# Patient Record
Sex: Male | Born: 1995 | Race: White | Hispanic: No | Marital: Single | Smoking: Never smoker
Health system: Southern US, Community
[De-identification: ages and names within clinical notes are randomized; demographics above are authoritative.]

---

## 2021-06-03 ENCOUNTER — Other Ambulatory Visit: Payer: Self-pay

## 2021-06-03 ENCOUNTER — Encounter (HOSPITAL_COMMUNITY): Payer: Self-pay

## 2021-06-03 ENCOUNTER — Emergency Department (HOSPITAL_COMMUNITY)
Admission: EM | Admit: 2021-06-03 | Discharge: 2021-06-03 | Disposition: A | Discharge status: Home / Self Care | Payer: Self-pay | Attending: Emergency Medicine | Admitting: Emergency Medicine

## 2021-06-03 ENCOUNTER — Emergency Department (HOSPITAL_COMMUNITY): Payer: Self-pay

## 2021-06-03 DIAGNOSIS — R519 Headache, unspecified: Secondary | ICD-10-CM | POA: Insufficient documentation

## 2021-06-03 DIAGNOSIS — H538 Other visual disturbances: Secondary | ICD-10-CM | POA: Insufficient documentation

## 2021-06-03 DIAGNOSIS — H9313 Tinnitus, bilateral: Secondary | ICD-10-CM | POA: Insufficient documentation

## 2021-06-03 MED ORDER — IBUPROFEN 200 MG PO TABS
600.0000 mg | ORAL_TABLET | Freq: Once | ORAL | Status: AC
  Administered 2021-06-03: 600 mg via ORAL
  Filled 2021-06-03: qty 3

## 2021-06-03 NOTE — ED Provider Notes (Signed)
[facility]-EMERGENCY DEPT Provider Note   CSN: [id number] Arrival date & time: 06/03/21  0020     History Chief Complaint  Patient presents with   Headache    Calvin Owens is a 25 y.o. male.  The history is provided by the patient.  Headache Calvin Owens is a 25 y.o. male who presents to the Emergency Department complaining of HA.  He presents to the ED for evaluation HA that began around 11pm.  HA is described as firey and located in the back of the head.  Sxs started about 30 minutes after smoked marijuana out of a bong.  HA is improving at time of ED assesment.  HA is worse than his typical HA.  He temporarily lost most of his vision and hearing (blurry vision and ringing in the ears).  Felt hot then cold.    No nausea or vomiting.  Vision and hearing back to baseline.  No new numbness/weakness.  No fever.    Has no known medical problems.    No tobacco.  Occasional alcohol (half a beer tonight).  Uses marijuana regularly.      History reviewed. No pertinent past medical history.  There are no problems to display for this patient.   History reviewed. No pertinent surgical history.     No family history on file.  Social History   Tobacco Use   Smoking status: Never   Smokeless tobacco: Never  Substance Use Topics   Alcohol use: Not Currently   Drug use: Yes    Types: Marijuana    Home Medications Prior to Admission medications   Not on File    Allergies    Patient has no known allergies.  Review of Systems   Review of Systems  Neurological:  Positive for headaches.  All other systems reviewed and are negative.  Physical Exam Updated Vital Signs BP 109/60   Pulse 81   Temp 97.8 F (36.6 C) (Oral)   Resp 15   Ht 5\' 10"  (1.778 m)   Wt 90.7 kg   SpO2 96%   BMI 28.70 kg/m   Physical Exam Vitals and nursing note reviewed.  Constitutional:      Appearance: He is well-developed.  HENT:     Head: Normocephalic and  atraumatic.  Cardiovascular:     Rate and Rhythm: Normal rate and regular rhythm.     Heart sounds: No murmur heard. Pulmonary:     Effort: Pulmonary effort is normal. No respiratory distress.     Breath sounds: Normal breath sounds.  Abdominal:     Palpations: Abdomen is soft.     Tenderness: There is no abdominal tenderness. There is no guarding or rebound.  Musculoskeletal:        General: No tenderness.  Skin:    General: Skin is warm and dry.  Neurological:     Mental Status: He is alert and oriented to person, place, and time.     Comments: No asymmetry of facial movements. Visual fields grossly intact. Five out of five strength in all four extremities  Psychiatric:        Behavior: Behavior normal.    ED Results / Procedures / Treatments   Labs (all labs ordered are listed, but only abnormal results are displayed) Labs Reviewed - No data to display  EKG None  Radiology CT Head Wo Contrast  Result Date: 06/03/2021 CLINICAL DATA:  Initial evaluation for acute headache. EXAM: CT HEAD WITHOUT CONTRAST TECHNIQUE: Contiguous axial images  were obtained from the base of the skull through the vertex without intravenous contrast. COMPARISON:  None. FINDINGS: Brain: Cerebral volume within normal limits for patient age. No evidence for acute intracranial hemorrhage. No findings to suggest acute large vessel territory infarct. No mass lesion, midline shift, or mass effect. Ventricles are normal in size without evidence for hydrocephalus. No extra-axial fluid collection identified. Vascular: No hyperdense vessel identified. Skull: Scalp soft tissues demonstrate no acute abnormality. Calvarium intact. Sinuses/Orbits: Globes and orbital soft tissues within normal limits. Visualized paranasal sinuses are clear. No mastoid effusion. IMPRESSION: Normal head CT.  No acute intracranial abnormality. Electronically Signed   By: Rise Mu M.D.   On: 06/03/2021 02:07     Procedures Procedures   Medications Ordered in ED Medications  ibuprofen (ADVIL) tablet 600 mg (600 mg Oral Given 06/03/21 0242)    ED Course  I have reviewed the triage vital signs and the nursing notes.  Pertinent labs & imaging results that were available during my care of the patient were reviewed by me and considered in my medical decision making (see chart for details).    MDM Rules/Calculators/A&P                          patient here for evaluation of headache,  transient change in vision and hearing which occurred shortly after using marijuana. On evaluation his headache is improving and he is non-toxic appearing. He has no focal neurologic deficits. He does describe this is a severe headache. CT head is negative for acute abnormality. Discussed with patient recommendation to discontinue marijuana use as this may be contributing to his symptoms. Recommend ibuprofen/Tylenol as needed according to label instructions as needed for headache.  Presentation is not consistent with meningitis, dural sinus thrombosis, aneurysm. Final Clinical Impression(s) / ED Diagnoses Final diagnoses:  Bad headache    Rx / DC Orders ED Discharge Orders     None        Tilden Fossa, MD 06/03/21 [phone]

## 2021-06-03 NOTE — ED Notes (Signed)
Pt states H/A and loss of vision/hearing began approximately 30 minutes after smoking marijuana this evening, same batch he has been smoking off of for a week.  Vision became blurry and hearing became muffled for 5 minutes but then resolved.  H/A still present but has improved, did not attempt to take anything for the pain.  Pt denies use of any other substances.  Does state he hasn't been eating a lot lately but had a large dinner tonight and did not feel very well after but it resolved as well.

## 2021-06-03 NOTE — Discharge Instructions (Addendum)
Stop smoking marijuana as this may be contributing to your headaches. Please follow-up with your family doctor for further evaluation. You may take Tylenol or ibuprofen, available over-the-counter according to label instructions as needed for headache.

## 2021-06-03 NOTE — ED Triage Notes (Signed)
Pt arrives EMS from a friends house with c/o sudden "splitting headache", loss of hearing in both ears and blurry vision that began about 2345 tonight. Admits to marijuana use prior to arrival.

## 2022-09-06 IMAGING — CT CT HEAD W/O CM
3 of 4 series · 15 of 47 positions shown, 18 images · non-contrast
Comparison: None.

CLINICAL DATA: Initial evaluation for acute headache.

EXAM:
CT HEAD WITHOUT CONTRAST
TECHNIQUE: Contiguous axial images were obtained from the base of the skull
through the vertex without intravenous contrast.

[Series 3: head wo · axial · 0.43mm/px · z∈[-168,-48]mm · 9 of 32 slices shown, 12 images]
[im 4/32  brain]
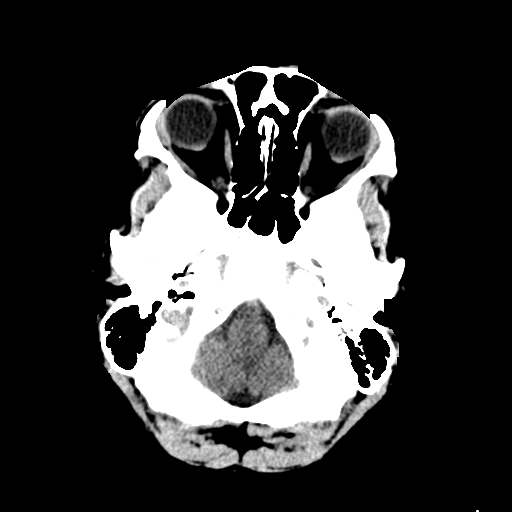
[im 4/32  bone]
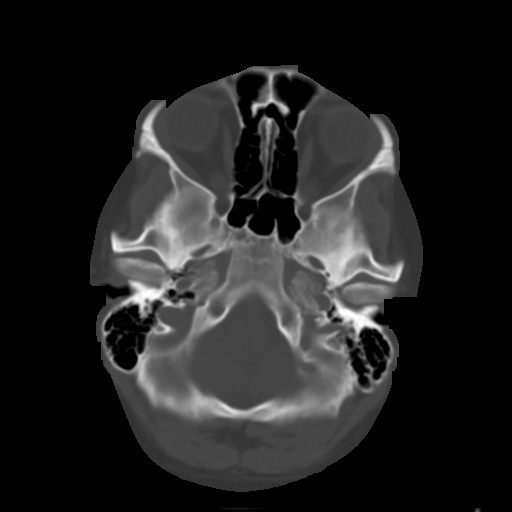
[im 7/32  brain]
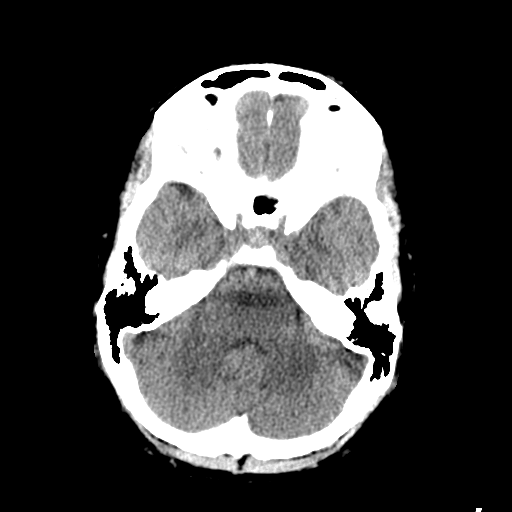
[im 10/32  brain]
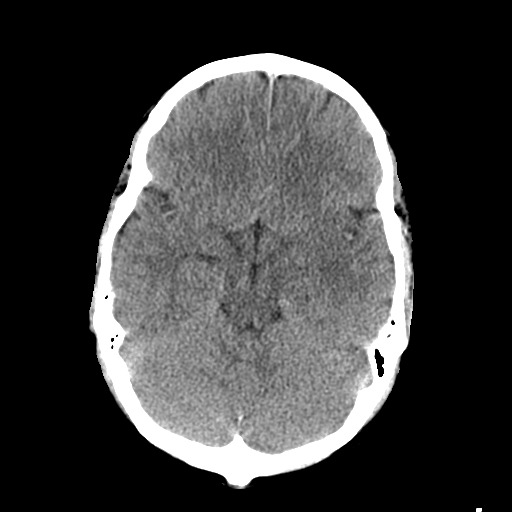
[im 13/32  brain]
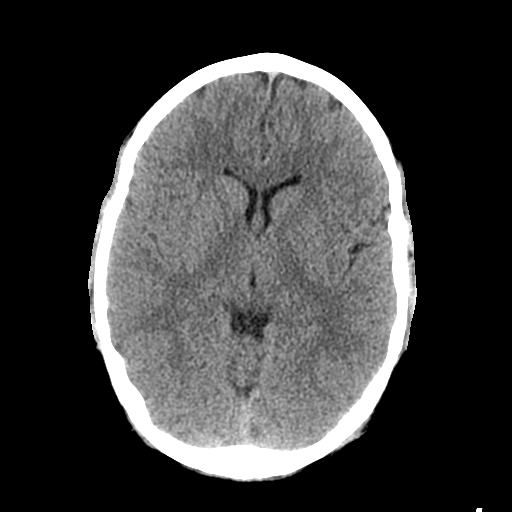
[im 16/32  brain]
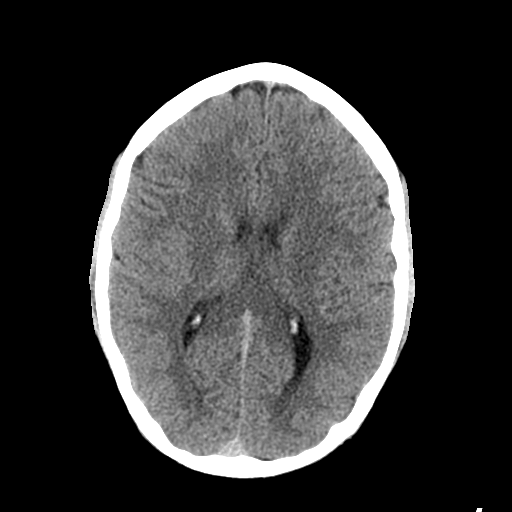
[im 16/32  bone]
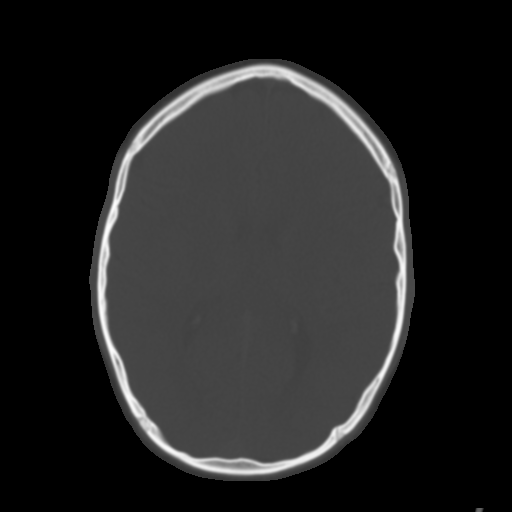
[im 19/32  brain]
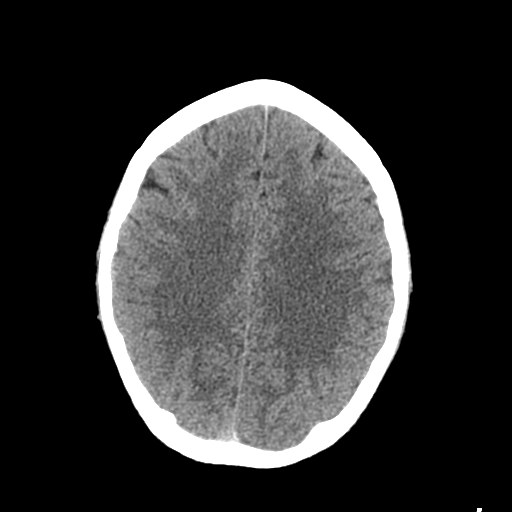
[im 22/32  brain]
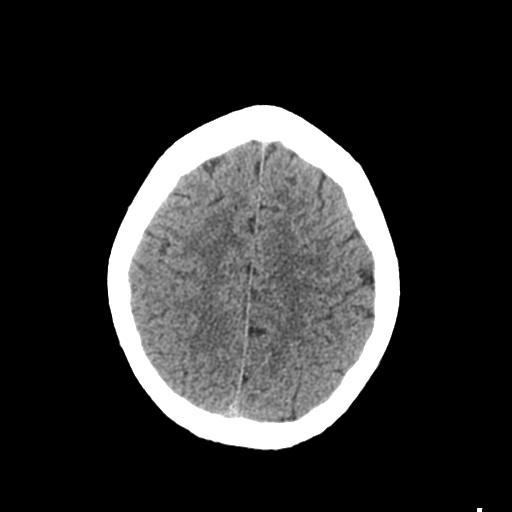
[im 25/32  brain]
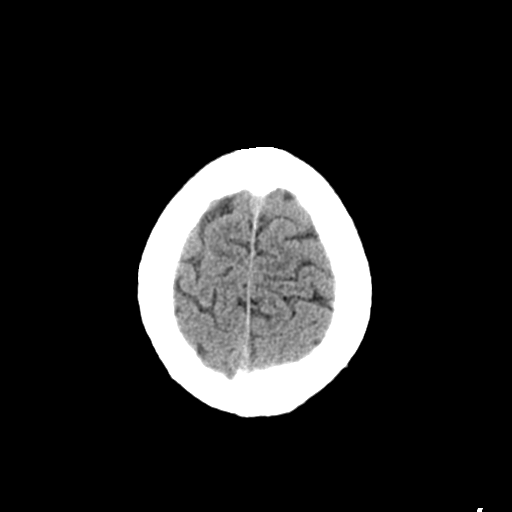
[im 28/32  brain]
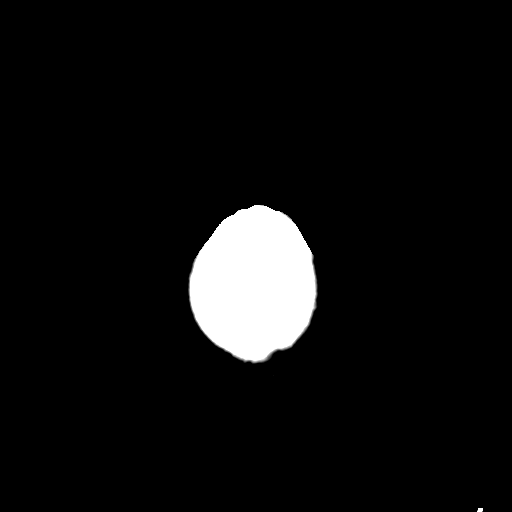
[im 28/32  bone]
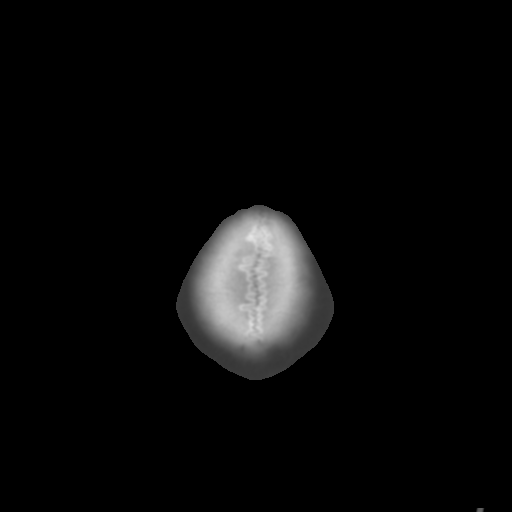

[Series 5: coronal soft tissue · coronal · 0.33mm/px · 3 of 74 slices shown]
[im 25/74  brain]
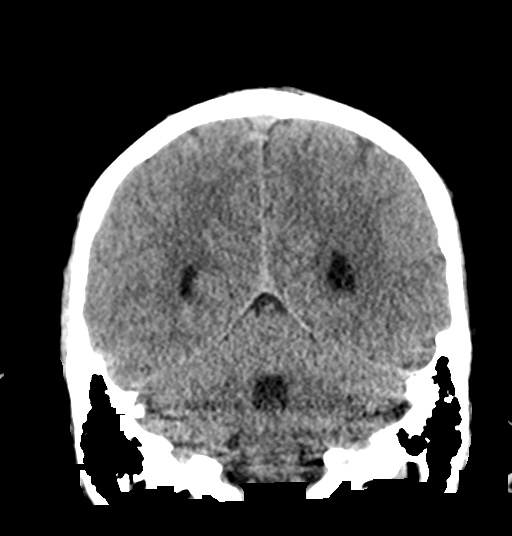
[im 33/74  brain]
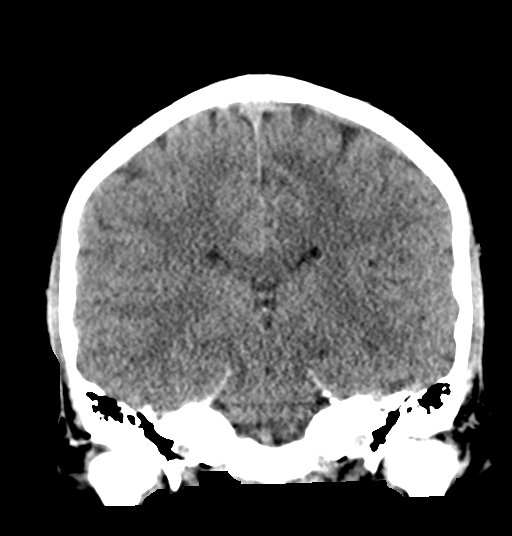
[im 41/74  brain]
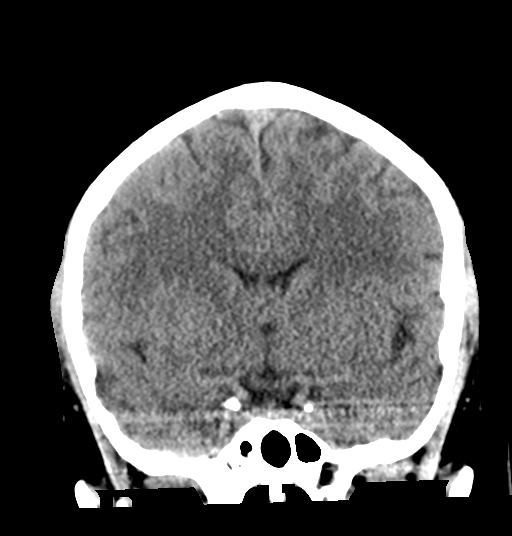

[Series 6: sagittal soft tissue · sagittal · 0.35mm/px · 3 of 57 slices shown]
[im 19/57  brain]
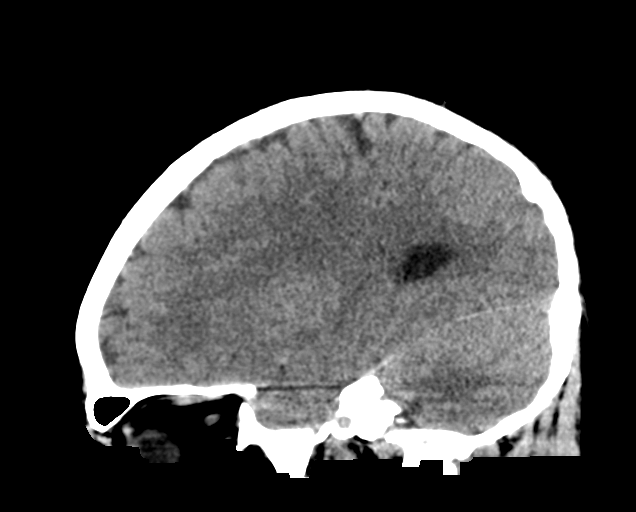
[im 29/57  brain]
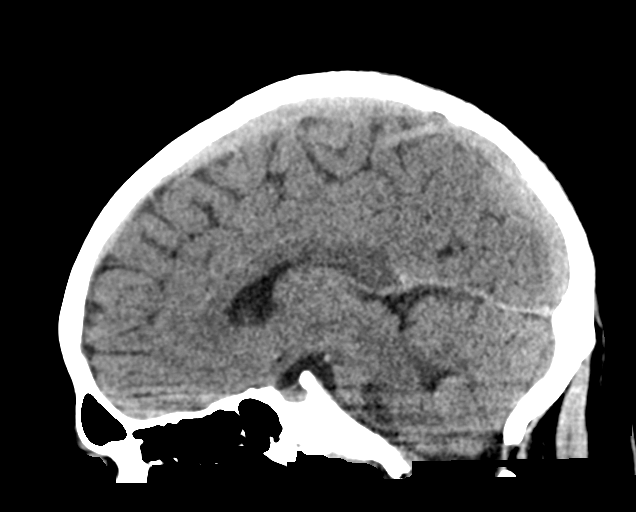
[im 38/57  brain]
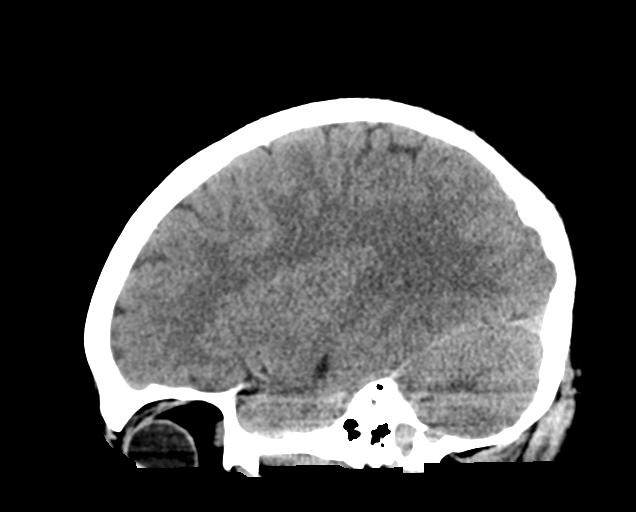

[15 of 47 positions shown; findings below may reference images not displayed]

FINDINGS: Brain: Cerebral volume within normal limits for patient age.

No evidence for acute intracranial hemorrhage. No findings to
suggest acute large vessel territory infarct. No mass lesion,
midline shift, or mass effect. Ventricles are normal in size without
evidence for hydrocephalus. No extra-axial fluid collection
identified.

Vascular: No hyperdense vessel identified.

Skull: Scalp soft tissues demonstrate no acute abnormality.
Calvarium intact.

Sinuses/Orbits: Globes and orbital soft tissues within normal
limits.

Visualized paranasal sinuses are clear. No mastoid effusion.
IMPRESSION: Normal head CT.  No acute intracranial abnormality.
